# Patient Record
Sex: Female | Born: 1995 | Race: White | Hispanic: No | Marital: Single | Smoking: Current every day smoker
Health system: Southern US, Community
[De-identification: ages and names within clinical notes are randomized; demographics above are authoritative.]

## PROBLEM LIST (undated history)

## (undated) DIAGNOSIS — F419 Anxiety disorder, unspecified: Secondary | ICD-10-CM

## (undated) HISTORY — PX: TONSILLECTOMY: SUR1361

## (undated) HISTORY — PX: CHOLECYSTECTOMY: SHX55

---
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
## 2016-03-27 ENCOUNTER — Other Ambulatory Visit: Payer: Self-pay | Admitting: Gastroenterology

## 2016-03-27 DIAGNOSIS — R1013 Epigastric pain: Secondary | ICD-10-CM

## 2016-04-19 ENCOUNTER — Ambulatory Visit
Admission: RE | Admit: 2016-04-19 | Discharge: 2016-04-19 | Disposition: A | Discharge status: Home / Self Care | Payer: Medicaid Other | Source: Ambulatory Visit | Attending: Gastroenterology | Admitting: Gastroenterology

## 2016-04-19 DIAGNOSIS — R1013 Epigastric pain: Secondary | ICD-10-CM | POA: Insufficient documentation

## 2016-04-19 DIAGNOSIS — N1330 Unspecified hydronephrosis: Secondary | ICD-10-CM | POA: Insufficient documentation

## 2016-04-19 MED ORDER — TECHNETIUM TC 99M MEBROFENIN IV KIT
5.1700 | PACK | Freq: Once | INTRAVENOUS | Status: AC | PRN
  Administered 2016-04-19: 5.17 via INTRAVENOUS

## 2016-05-08 ENCOUNTER — Other Ambulatory Visit: Payer: Self-pay | Admitting: Gastroenterology

## 2016-05-08 DIAGNOSIS — R1013 Epigastric pain: Secondary | ICD-10-CM

## 2016-05-12 ENCOUNTER — Ambulatory Visit
Admission: RE | Admit: 2016-05-12 | Discharge: 2016-05-12 | Disposition: A | Discharge status: Home / Self Care | Payer: Medicaid Other | Source: Ambulatory Visit | Attending: Gastroenterology | Admitting: Gastroenterology

## 2016-05-12 DIAGNOSIS — R1013 Epigastric pain: Secondary | ICD-10-CM | POA: Diagnosis not present

## 2016-05-12 DIAGNOSIS — K219 Gastro-esophageal reflux disease without esophagitis: Secondary | ICD-10-CM | POA: Insufficient documentation

## 2017-06-08 ENCOUNTER — Encounter: Payer: Self-pay | Admitting: Certified Nurse Midwife

## 2018-08-08 ENCOUNTER — Emergency Department
Admission: EM | Admit: 2018-08-08 | Discharge: 2018-08-08 | Disposition: A | Discharge status: Home / Self Care | Payer: Medicaid - Out of State | Attending: Emergency Medicine | Admitting: Emergency Medicine

## 2018-08-08 ENCOUNTER — Other Ambulatory Visit: Payer: Self-pay

## 2018-08-08 ENCOUNTER — Emergency Department: Payer: Medicaid - Out of State

## 2018-08-08 DIAGNOSIS — F1210 Cannabis abuse, uncomplicated: Secondary | ICD-10-CM | POA: Diagnosis not present

## 2018-08-08 DIAGNOSIS — R0602 Shortness of breath: Secondary | ICD-10-CM | POA: Insufficient documentation

## 2018-08-08 DIAGNOSIS — R079 Chest pain, unspecified: Secondary | ICD-10-CM | POA: Diagnosis present

## 2018-08-08 DIAGNOSIS — F17210 Nicotine dependence, cigarettes, uncomplicated: Secondary | ICD-10-CM | POA: Insufficient documentation

## 2018-08-08 HISTORY — DX: Anxiety disorder, unspecified: F41.9

## 2018-08-08 LAB — HEPATIC FUNCTION PANEL
ALBUMIN: 4.2 g/dL (ref 3.5–5.0)
ALT: 27 U/L (ref 0–44)
AST: 24 U/L (ref 15–41)
Alkaline Phosphatase: 89 U/L (ref 38–126)
Bilirubin, Direct: <0.1 mg/dL (ref 0.0–0.2)
TOTAL PROTEIN: 7.1 g/dL (ref 6.5–8.1)
Total Bilirubin: 0.4 mg/dL (ref 0.3–1.2)

## 2018-08-08 LAB — TROPONIN I

## 2018-08-08 LAB — URINALYSIS, COMPLETE (UACMP) WITH MICROSCOPIC
BILIRUBIN URINE: NEGATIVE
Bacteria, UA: NONE SEEN
Glucose, UA: NEGATIVE mg/dL
KETONES UR: NEGATIVE mg/dL
LEUKOCYTE UA: NEGATIVE
Nitrite: NEGATIVE
PROTEIN: NEGATIVE mg/dL
Specific Gravity, Urine: 1.024 (ref 1.005–1.030)
pH: 6 (ref 5.0–8.0)

## 2018-08-08 LAB — CBC
HEMATOCRIT: 38.1 % (ref 36.0–46.0)
Hemoglobin: 12.6 g/dL (ref 12.0–15.0)
MCH: 29 pg (ref 26.0–34.0)
MCHC: 33.1 g/dL (ref 30.0–36.0)
MCV: 87.6 fL (ref 80.0–100.0)
NRBC: 0 % (ref 0.0–0.2)
PLATELETS: 302 10*3/uL (ref 150–400)
RBC: 4.35 MIL/uL (ref 3.87–5.11)
RDW: 12.1 % (ref 11.5–15.5)
WBC: 11.5 10*3/uL — AB (ref 4.0–10.5)

## 2018-08-08 LAB — BASIC METABOLIC PANEL
Anion gap: 10 (ref 5–15)
BUN: 14 mg/dL (ref 6–20)
CHLORIDE: 109 mmol/L (ref 98–111)
CO2: 21 mmol/L — ABNORMAL LOW (ref 22–32)
CREATININE: 0.7 mg/dL (ref 0.44–1.00)
Calcium: 9.5 mg/dL (ref 8.9–10.3)
Glucose, Bld: 121 mg/dL — ABNORMAL HIGH (ref 70–99)
Potassium: 3.4 mmol/L — ABNORMAL LOW (ref 3.5–5.1)
SODIUM: 140 mmol/L (ref 135–145)

## 2018-08-08 LAB — URINE DRUG SCREEN, QUALITATIVE (ARMC ONLY)
AMPHETAMINES, UR SCREEN: NOT DETECTED
BARBITURATES, UR SCREEN: NOT DETECTED
BENZODIAZEPINE, UR SCRN: NOT DETECTED
Cannabinoid 50 Ng, Ur ~~LOC~~: POSITIVE — AB
Cocaine Metabolite,Ur ~~LOC~~: NOT DETECTED
MDMA (Ecstasy)Ur Screen: NOT DETECTED
METHADONE SCREEN, URINE: NOT DETECTED
OPIATE, UR SCREEN: NOT DETECTED
PHENCYCLIDINE (PCP) UR S: NOT DETECTED
Tricyclic, Ur Screen: NOT DETECTED

## 2018-08-08 LAB — FIBRIN DERIVATIVES D-DIMER (ARMC ONLY): FIBRIN DERIVATIVES D-DIMER (ARMC): 942.34 ng{FEU}/mL — AB (ref 0.00–499.00)

## 2018-08-08 LAB — LIPASE, BLOOD: LIPASE: 27 U/L (ref 11–51)

## 2018-08-08 LAB — POCT PREGNANCY, URINE: Preg Test, Ur: NEGATIVE

## 2018-08-08 LAB — T4, FREE: Free T4: 1.09 ng/dL (ref 0.82–1.77)

## 2018-08-08 LAB — TSH: TSH: 1.903 u[IU]/mL (ref 0.350–4.500)

## 2018-08-08 MED ORDER — FAMOTIDINE IN NACL 20-0.9 MG/50ML-% IV SOLN
20.0000 mg | Freq: Once | INTRAVENOUS | Status: AC
  Administered 2018-08-08: 20 mg via INTRAVENOUS
  Filled 2018-08-08: qty 50

## 2018-08-08 MED ORDER — IOHEXOL 350 MG/ML SOLN
100.0000 mL | Freq: Once | INTRAVENOUS | Status: AC | PRN
  Administered 2018-08-08: 100 mL via INTRAVENOUS

## 2018-08-08 MED ORDER — ALBUTEROL SULFATE (2.5 MG/3ML) 0.083% IN NEBU
2.5000 mg | INHALATION_SOLUTION | Freq: Once | RESPIRATORY_TRACT | Status: AC
  Administered 2018-08-08: 2.5 mg via RESPIRATORY_TRACT
  Filled 2018-08-08: qty 3

## 2018-08-08 MED ORDER — SODIUM CHLORIDE 0.9 % IV BOLUS
1000.0000 mL | Freq: Once | INTRAVENOUS | Status: AC
  Administered 2018-08-08: 1000 mL via INTRAVENOUS

## 2018-08-08 NOTE — ED Notes (Signed)
Denies cough,congestion and fever

## 2018-08-08 NOTE — ED Triage Notes (Addendum)
Pt arrives to ED via POV from home with c/o SHOB, RLQ abdominal pain, and bilateral leg numbness x40 minutes PTA. No c/o's of N/V/D, no fever. No recent cough or illness. Pt reports h/x of anxiety, but denies h/x of "panic attacks". Pt is A&O, in NAD; RR even, regular, and unlabored.

## 2018-08-08 NOTE — Discharge Instructions (Addendum)
1.  Take ibuprofen as needed for discomfort. 2.  Apply moist heat to affected area several times daily. 3.  Return to the ER for worsening symptoms, persistent vomiting, difficulty breathing or other concerns.

## 2018-08-08 NOTE — ED Provider Notes (Signed)
[facility] Emergency Department Provider Note   ____________________________________________   First MD Initiated Contact with Patient 08/08/18 0354     (approximate)  I have reviewed the triage vital signs and the nursing notes.   HISTORY  Chief Complaint Shortness of Breath    HPI Anita Barajas is a 23 y.o. female who presents to the ED from home with a chief complaint of  chest pain and shortness of breath.  Patient reports at 1 AM she was sitting in bed watching TV, no exertion, no emotional distress, when she had sudden onset of sharp central chest pain associated with shortness of breath.  She felt panicky and numb.  States that she has had some right upper abdominal pain for the past few days.  Has a history of cholecystectomy.  Denies recent fever, chills, cough, congestion, nausea, vomiting, dysuria, diarrhea.  Denies recent travel or trauma.   Past Medical History:  Diagnosis Date  . Anxiety     There are no active problems to display for this patient.   Past Surgical History:  Procedure Laterality Date  . CHOLECYSTECTOMY    . TONSILLECTOMY      Prior to Admission medications   Medication Sig Start Date End Date Taking? Authorizing Provider  ibuprofen (ADVIL,MOTRIN) 200 MG tablet Take 200 mg by mouth as needed for fever, headache or mild pain.   Yes [provider]    Allergies Patient has no known allergies.  No family history on file.  Social History Social History   Tobacco Use  . Smoking status: Current Every Day Smoker  . Smokeless tobacco: Never Used  Substance Use Topics  . Alcohol use: Not on file  . Drug use: Not on file    Review of Systems  Constitutional: No fever/chills Eyes: No visual changes. ENT: No sore throat. Cardiovascular: Positive for chest pain. Respiratory: Positive for shortness of breath. Gastrointestinal: Positive for abdominal pain.  No nausea, no vomiting.  No diarrhea.  No  constipation. Genitourinary: Negative for dysuria. Musculoskeletal: Negative for back pain. Skin: Negative for rash. Neurological: Negative for headaches, focal weakness or numbness.   ____________________________________________   PHYSICAL EXAM:  VITAL SIGNS: ED Triage Vitals  Enc Vitals Group     BP 08/08/18 0217 114/85     Pulse Rate 08/08/18 0217 (!) 141     Resp 08/08/18 0217 18     Temp 08/08/18 0217 98.3 F (36.8 C)     Temp Source 08/08/18 0217 Oral     SpO2 08/08/18 0217 100 %     Weight 08/08/18 0218 200 lb (90.7 kg)     Height 08/08/18 0218 5\' 8"  (1.727 m)     Head Circumference --      Peak Flow --      Pain Score 08/08/18 0218 6     Pain Loc --      Pain Edu? --      Excl. in GC? --     Constitutional: Alert and oriented. Well appearing and in no acute distress. Eyes: Conjunctivae are normal. PERRL. EOMI. Head: Atraumatic. Nose: No congestion/rhinnorhea. Mouth/Throat: Mucous membranes are moist.  Oropharynx non-erythematous. Neck: No stridor.   Cardiovascular: Normal rate, regular rhythm. Grossly normal heart sounds.  Good peripheral circulation. Respiratory: Normal respiratory effort.  No retractions. Lungs slightly diminished bilaterally. Gastrointestinal: Obese.  Soft and nontender to light or deep palpation. No distention. No abdominal bruits. No CVA tenderness. Musculoskeletal: No lower extremity tenderness nor edema.  No calf pain.  No joint effusions.  2+ femoral and distal pulses. Neurologic:  Normal speech and language. No gross focal neurologic deficits are appreciated. No gait instability. Skin:  Skin is warm, dry and intact. No rash noted. Psychiatric: Mood and affect are slightly anxious. Speech and behavior are normal.  ____________________________________________   LABS (all labs ordered are listed, but only abnormal results are displayed)  Labs Reviewed  BASIC METABOLIC PANEL - Abnormal; Notable for the following components:       Result Value   Potassium 3.4 (*)    CO2 21 (*)    Glucose, Bld 121 (*)    All other components within normal limits  CBC - Abnormal; Notable for the following components:   WBC 11.5 (*)    All other components within normal limits  FIBRIN DERIVATIVES D-DIMER ([facility] ONLY) - Abnormal; Notable for the following components:   Fibrin derivatives D-dimer ([facility]) 942.34 (*)    All other components within normal limits  URINALYSIS, COMPLETE (UACMP) WITH MICROSCOPIC - Abnormal; Notable for the following components:   Color, Urine YELLOW (*)    APPearance CLEAR (*)    Hgb urine dipstick MODERATE (*)    All other components within normal limits  URINE DRUG SCREEN, QUALITATIVE ([facility] ONLY) - Abnormal; Notable for the following components:   Cannabinoid 50 Ng, Ur [place] POSITIVE (*)    All other components within normal limits  TROPONIN I  HEPATIC FUNCTION PANEL  LIPASE, BLOOD  TROPONIN I  TSH  T4, FREE  POC URINE PREG, ED  POCT PREGNANCY, URINE   ____________________________________________  EKG  ED ECG REPORT I, Jamir Rone J, the attending physician, personally viewed and interpreted this ECG.   Date: 08/08/2018  EKG Time: 0220  Rate: 124  Rhythm: sinus tachycardia  Axis: Normal  Intervals:none  ST&T Change: Nonspecific  ____________________________________________  RADIOLOGY  ED MD interpretation: No acute cardiopulmonary process; no PE  Official radiology report(s): Dg Chest 2 View  Result Date: 08/08/2018 CLINICAL DATA:  Shortness of breath EXAM: CHEST - 2 VIEW COMPARISON:  None FINDINGS: The heart size and mediastinal contours are within normal limits. Both lungs are clear. The visualized skeletal structures are unremarkable. IMPRESSION: No active cardiopulmonary disease. Electronically Signed   By: Deatra Robinson M.D.   On: 08/08/2018 02:39   Ct Angio Chest Pe W/cm &/or Wo Cm  Result Date: 08/08/2018 CLINICAL DATA:  Chest pain and shortness of breath. EXAM: CT ANGIOGRAPHY  CHEST WITH CONTRAST TECHNIQUE: Multidetector CT imaging of the chest was performed using the standard protocol during bolus administration of intravenous contrast. Multiplanar CT image reconstructions and MIPs were obtained to evaluate the vascular anatomy. CONTRAST:  OMNIPAQUE IOHEXOL 350 MG/ML SOLN COMPARISON:  None. FINDINGS: Cardiovascular: Suboptimal (due to soft tissue attenuation and bolus dispersion) but satisfactory opacification of the pulmonary arteries to the segmental level. No evidence of pulmonary embolism. Normal heart size. No pericardial effusion. Mediastinum/Nodes: Negative for adenopathy Lungs/Pleura: There is no edema, consolidation, effusion, or pneumothorax. Upper Abdomen: Negative Musculoskeletal: Negative Review of the MIP images confirms the above findings. IMPRESSION: Negative chest CTA. Electronically Signed   By: Marnee Spring M.D.   On: 08/08/2018 06:31    ____________________________________________   PROCEDURES  Procedure(s) performed: None  Procedures  Critical Care performed: No  ____________________________________________   INITIAL IMPRESSION / ASSESSMENT AND PLAN / ED COURSE  As part of my medical decision making, I reviewed the following data within the electronic MEDICAL RECORD NUMBER Nursing notes reviewed  and incorporated, Labs reviewed, EKG interpreted, Old chart reviewed, Radiograph reviewed and Notes from prior ED visits    23 year old female who presents with chest pain and shortness of breath.  Admits to marijuana use around 9 PM. Differential diagnosis includes, but is not limited to, ACS, aortic dissection, pulmonary embolism, cardiac tamponade, pneumothorax, pneumonia, pericarditis, myocarditis, GI-related causes including esophagitis/gastritis, and musculoskeletal chest wall pain.    Laboratory results including troponin from triage unremarkable.  Will add LFTs, lipase for complaints of abdominal pain.  Will repeat timed troponin.  Will  check d-dimer, UA, UDS.  Will initiate IV fluid resuscitation, albuterol nebulizer treatment, IV Pepcid and reassess.   Clinical Course as of Aug 08 705  Thu Aug 08, 2018  0515 Elevated d-dimer noted.  Will obtain CTA chest to evaluate for pulmonary embolus.   [JS]  [id number] Updated patient on negative CT result.  Strict return precautions given.  Patient verbalizes understanding and agrees with plan of care.   [JS]    Clinical Course User Index [JS] Irean HongSung, Romulo Okray J, MD     ____________________________________________   FINAL CLINICAL IMPRESSION(S) / ED DIAGNOSES  Final diagnoses:  Chest pain, unspecified type     ED Discharge Orders    None       Note:  This document was prepared using Dragon voice recognition software and may include unintentional dictation errors.   Irean HongSung, Terrisa Curfman J, MD 08/08/18 [phone]

## 2018-08-19 ENCOUNTER — Other Ambulatory Visit: Payer: Self-pay

## 2018-08-19 ENCOUNTER — Encounter: Payer: Self-pay | Admitting: Emergency Medicine

## 2018-08-19 DIAGNOSIS — F17200 Nicotine dependence, unspecified, uncomplicated: Secondary | ICD-10-CM | POA: Insufficient documentation

## 2018-08-19 DIAGNOSIS — G43909 Migraine, unspecified, not intractable, without status migrainosus: Secondary | ICD-10-CM | POA: Diagnosis not present

## 2018-08-19 DIAGNOSIS — R51 Headache: Secondary | ICD-10-CM | POA: Diagnosis present

## 2018-08-19 NOTE — ED Triage Notes (Signed)
Patient ambulatory to triage with steady gait, without difficulty or distress noted; pt reports x wk having right sided HA accomp by nausea; denies hx of same; BC powder taken at 5pm without relief

## 2018-08-20 ENCOUNTER — Emergency Department
Admission: EM | Admit: 2018-08-20 | Discharge: 2018-08-20 | Disposition: A | Discharge status: Home / Self Care | Payer: Medicaid - Out of State | Attending: Emergency Medicine | Admitting: Emergency Medicine

## 2018-08-20 DIAGNOSIS — F410 Panic disorder [episodic paroxysmal anxiety] without agoraphobia: Secondary | ICD-10-CM

## 2018-08-20 DIAGNOSIS — G43909 Migraine, unspecified, not intractable, without status migrainosus: Secondary | ICD-10-CM

## 2018-08-20 MED ORDER — METOCLOPRAMIDE HCL 5 MG/ML IJ SOLN
10.0000 mg | INTRAMUSCULAR | Status: AC
  Administered 2018-08-20: 10 mg via INTRAVENOUS
  Filled 2018-08-20: qty 2

## 2018-08-20 MED ORDER — ACETAMINOPHEN 500 MG PO TABS
1000.0000 mg | ORAL_TABLET | Freq: Once | ORAL | Status: AC
  Administered 2018-08-20: 1000 mg via ORAL
  Filled 2018-08-20: qty 2

## 2018-08-20 MED ORDER — DEXAMETHASONE SODIUM PHOSPHATE 10 MG/ML IJ SOLN
10.0000 mg | Freq: Once | INTRAMUSCULAR | Status: AC
  Administered 2018-08-20: 10 mg via INTRAVENOUS
  Filled 2018-08-20: qty 1

## 2018-08-20 MED ORDER — SODIUM CHLORIDE 0.9 % IV BOLUS
500.0000 mL | Freq: Once | INTRAVENOUS | Status: AC
  Administered 2018-08-20: 500 mL via INTRAVENOUS

## 2018-08-20 MED ORDER — KETOROLAC TROMETHAMINE 30 MG/ML IJ SOLN
15.0000 mg | Freq: Once | INTRAMUSCULAR | Status: AC
  Administered 2018-08-20: 15 mg via INTRAVENOUS
  Filled 2018-08-20: qty 1

## 2018-08-20 MED ORDER — DIPHENHYDRAMINE HCL 50 MG/ML IJ SOLN
25.0000 mg | INTRAMUSCULAR | Status: AC
  Administered 2018-08-20: 25 mg via INTRAVENOUS
  Filled 2018-08-20: qty 1

## 2018-08-20 MED ORDER — LORAZEPAM 2 MG/ML IJ SOLN
1.0000 mg | Freq: Once | INTRAMUSCULAR | Status: AC
  Administered 2018-08-20: 1 mg via INTRAVENOUS
  Filled 2018-08-20: qty 1

## 2018-08-20 NOTE — ED Notes (Signed)
Pt voicing concern to this RN that she is ready to go home. Pt states she feels as though being in the hospital is giving her more anxiety and she wishes to go home. Pt states her head feels better, but wishes to go home. MD aware and at bedside to talk to pt. This RN will continue to monitor.

## 2018-08-20 NOTE — ED Provider Notes (Signed)
[facility] Emergency Department Provider Note  ____________________________________________   First MD Initiated Contact with Patient 08/20/18 0148     (approximate)  I have reviewed the triage vital signs and the nursing notes.   HISTORY  Chief Complaint Headache    HPI Anita Barajas is a 23 y.o. female with no contributory past medical history except possibly for anxiety who presents for evaluation of a right-sided severe headache that is been persistent for the last week.  She says it was gradual in onset and sometimes will improve or completely go away but comes right back.  There is no particular pattern as it occurs both in the morning and at night and it is not made better or worse by any particular activity.  She has been tried to take ibuprofen but it does not seem to help.  She has seen some spots from time to time in her vision.  Bright lights and loud noises seem to make it worse and she says that it is worse when she is feeling stressed.  She denies fever/chills, neck pain, neck stiffness, chest pain, shortness of breath, vomiting, and abdominal pain.  She has had some nausea associated with the pain.  She has not had headaches before and has no history of migraines.     Past Medical History:  Diagnosis Date  . Anxiety     There are no active problems to display for this patient.   Past Surgical History:  Procedure Laterality Date  . CHOLECYSTECTOMY    . TONSILLECTOMY      Prior to Admission medications   Medication Sig Start Date End Date Taking? Authorizing Provider  ibuprofen (ADVIL,MOTRIN) 200 MG tablet Take 200 mg by mouth as needed for fever, headache or mild pain.    [provider]    Allergies Patient has no known allergies.  No family history on file.  Social History Social History   Tobacco Use  . Smoking status: Current Every Day Smoker  . Smokeless tobacco: Never Used  Substance Use Topics  . Alcohol  use: Not on file  . Drug use: Yes    Types: Marijuana    Review of Systems Constitutional: No fever/chills Eyes: No visual deficits, sometimes sees some spots in her vision. ENT: No sore throat. Cardiovascular: Denies chest pain. Respiratory: Denies shortness of breath. Gastrointestinal: No abdominal pain.  Nausea, no vomiting.  No diarrhea.  No constipation. Genitourinary: Negative for dysuria. Musculoskeletal: Negative for neck pain.  Negative for back pain. Integumentary: Negative for rash. Neurological: Right-sided intermittent but persistent headache for about a week.  No focal numbness nor weakness.   ____________________________________________   PHYSICAL EXAM:  VITAL SIGNS: ED Triage Vitals  Enc Vitals Group     BP 08/19/18 1952 125/88     Pulse Rate 08/19/18 1952 (!) 113     Resp 08/19/18 1952 18     Temp 08/19/18 1952 98.8 F (37.1 C)     Temp Source 08/19/18 1952 Oral     SpO2 08/19/18 1952 100 %     Weight 08/19/18 1954 90.7 kg (200 lb)     Height 08/19/18 1954 1.727 m ( )     Head Circumference --      Peak Flow --      Pain Score 08/19/18 1954 8     Pain Loc --      Pain Edu? --      Excl. in GC? --  Constitutional: Alert and oriented. Well appearing and in no acute distress. Eyes: Conjunctivae are normal. PERRL. EOMI. Head: Atraumatic. Nose: No congestion/rhinnorhea. Mouth/Throat: Mucous membranes are moist. Neck: No stridor.  No meningeal signs.   Cardiovascular: Normal rate, regular rhythm. Good peripheral circulation. Grossly normal heart sounds. Respiratory: Normal respiratory effort.  No retractions. Lungs CTAB. Gastrointestinal: Soft and nontender. No distention.  Musculoskeletal: No lower extremity tenderness nor edema. No gross deformities of extremities. Neurologic:  Normal speech and language. No gross focal neurologic deficits are appreciated.  Skin:  Skin is warm, dry and intact. No rash noted. Psychiatric: Mood and affect are  normal. Speech and behavior are normal.  ____________________________________________   LABS (all labs ordered are listed, but only abnormal results are displayed)  Labs Reviewed - No data to display ____________________________________________  EKG  None - EKG not ordered by ED physician ____________________________________________  RADIOLOGY   ED MD interpretation: No indication for imaging  Official radiology report(s): No results found.  ____________________________________________   PROCEDURES   Procedure(s) performed (including Critical Care):  Procedures   ____________________________________________   INITIAL IMPRESSION / MDM / ASSESSMENT AND PLAN / ED COURSE  As part of my medical decision making, I reviewed the following data within the electronic MEDICAL RECORD NUMBER Nursing notes reviewed and incorporated, Notes from prior ED visits and [place] Controlled Substance Database       Differential diagnosis includes, but is not limited to, intracranial hemorrhage, meningitis/encephalitis, previous head trauma, cavernous venous thrombosis, tension headache, temporal arteritis, migraine or migraine equivalent, idiopathic intracranial hypertension, and non-specific headache.  However the patient is very well-appearing and in no distress.  Vital signs are normal and she is afebrile.  The symptoms are most consistent with a migraine.  I explained to her I do not think that she needs a head CT at this time and she is in agreement.  She says that there is no chance she could be pregnant so we will not check.  I have ordered an IV and the following medications: Normal saline 500 mL IV bolus, Reglan 10 mg IV, Benadryl 25 mg IV, Toradol 15 mg IV, Tylenol 1000 mg p.o., Decadron 10 mg IV.  The patient agrees to treatment for migraine and that I will reassess to determine if we need to do additional work-up.  I think that this is unlikely to be an emergent medical condition and will  likely be appropriate for outpatient follow-up.  Clinical Course as of Aug 20 322  Tue Aug 20, 2018  0300 The patient suddenly became very anxious and feels like her heart is pounding.  She keeps stating "I feel anxious, I do not feel right".  She will not stay still and is walking around the room.  She is not specifically having extraparametal side effects but I think she may be having a reaction either to the metoclopramide, or she is having an anxiety attack in general based on the way the medication is making her feel.  Of note, she says her headache is gone.At first I suggested giving her additional Benadryl in case this is a side effect of the metoclopramide, but she does not want any more Benadryl.  I commenced her to take Ativan 1 mg IV which should be helpful if she is having an anxiety attack based on the way she is feeling.  We will continue to monitor while she gets her IV fluids.   [CF]  0321 The patient is feeling a little bit better but wants  to go home now.  She clarified with her nurse that she "does this at home all the time" meaning has panic attacks.  Her headache is gone and I believe there is no evidence of any acute medical condition at this time.  I gave my usual customary return precautions.   [CF]    Clinical Course User Index [CF] Loleta Rose, MD    ____________________________________________  FINAL CLINICAL IMPRESSION(S) / ED DIAGNOSES  Final diagnoses:  Migraine without status migrainosus, not intractable, unspecified migraine type  Panic attack     MEDICATIONS GIVEN DURING THIS VISIT:  Medications  dexamethasone (DECADRON) injection 10 mg (10 mg Intravenous Given 08/20/18 0232)  metoCLOPramide (REGLAN) injection 10 mg (10 mg Intravenous Given 08/20/18 0232)  diphenhydrAMINE (BENADRYL) injection 25 mg (25 mg Intravenous Given 08/20/18 0231)  ketorolac (TORADOL) 30 MG/ML injection 15 mg (15 mg Intravenous Given 08/20/18 0233)  acetaminophen (TYLENOL) tablet  1,000 mg (1,000 mg Oral Given 08/20/18 0226)  sodium chloride 0.9 % bolus 500 mL (0 mLs Intravenous Stopped 08/20/18 0321)  LORazepam (ATIVAN) injection 1 mg (1 mg Intravenous Given 08/20/18 0306)     ED Discharge Orders    None       Note:  This document was prepared using Dragon voice recognition software and may include unintentional dictation errors.   Loleta Rose, MD 08/20/18 [phone]

## 2018-08-20 NOTE — Discharge Instructions (Signed)

## 2018-08-22 ENCOUNTER — Encounter: Payer: Self-pay | Admitting: Emergency Medicine

## 2018-08-22 ENCOUNTER — Emergency Department: Payer: Medicaid - Out of State

## 2018-08-22 ENCOUNTER — Other Ambulatory Visit: Payer: Self-pay

## 2018-08-22 ENCOUNTER — Emergency Department
Admission: EM | Admit: 2018-08-22 | Discharge: 2018-08-22 | Disposition: A | Discharge status: Home / Self Care | Payer: Medicaid - Out of State | Attending: Student in an Organized Health Care Education/Training Program | Admitting: Student in an Organized Health Care Education/Training Program

## 2018-08-22 DIAGNOSIS — R51 Headache: Secondary | ICD-10-CM | POA: Insufficient documentation

## 2018-08-22 DIAGNOSIS — R519 Headache, unspecified: Secondary | ICD-10-CM

## 2018-08-22 DIAGNOSIS — R079 Chest pain, unspecified: Secondary | ICD-10-CM | POA: Diagnosis present

## 2018-08-22 DIAGNOSIS — F17200 Nicotine dependence, unspecified, uncomplicated: Secondary | ICD-10-CM | POA: Insufficient documentation

## 2018-08-22 LAB — CBC WITH DIFFERENTIAL/PLATELET
Abs Immature Granulocytes: 0.03 10*3/uL (ref 0.00–0.07)
Basophils Absolute: 0 10*3/uL (ref 0.0–0.1)
Basophils Relative: 0 %
Eosinophils Absolute: 0 10*3/uL (ref 0.0–0.5)
Eosinophils Relative: 0 %
HEMATOCRIT: 36.4 % (ref 36.0–46.0)
Hemoglobin: 12.1 g/dL (ref 12.0–15.0)
Immature Granulocytes: 0 %
Lymphocytes Relative: 22 %
Lymphs Abs: 2 10*3/uL (ref 0.7–4.0)
MCH: 29.5 pg (ref 26.0–34.0)
MCHC: 33.2 g/dL (ref 30.0–36.0)
MCV: 88.8 fL (ref 80.0–100.0)
MONOS PCT: 7 %
Monocytes Absolute: 0.6 10*3/uL (ref 0.1–1.0)
Neutro Abs: 6.3 10*3/uL (ref 1.7–7.7)
Neutrophils Relative %: 71 %
Platelets: 296 10*3/uL (ref 150–400)
RBC: 4.1 MIL/uL (ref 3.87–5.11)
RDW: 12.6 % (ref 11.5–15.5)
WBC: 8.9 10*3/uL (ref 4.0–10.5)
nRBC: 0 % (ref 0.0–0.2)

## 2018-08-22 LAB — BASIC METABOLIC PANEL
Anion gap: 7 (ref 5–15)
BUN: 10 mg/dL (ref 6–20)
CO2: 22 mmol/L (ref 22–32)
Calcium: 9.1 mg/dL (ref 8.9–10.3)
Chloride: 107 mmol/L (ref 98–111)
Creatinine, Ser: 0.59 mg/dL (ref 0.44–1.00)
GFR calc Af Amer: >60 mL/min (ref >60)
GFR calc non Af Amer: >60 mL/min (ref >60)
Glucose, Bld: 112 mg/dL — ABNORMAL HIGH (ref 70–99)
Potassium: 3.5 mmol/L (ref 3.5–5.1)
Sodium: 136 mmol/L (ref 135–145)

## 2018-08-22 LAB — POCT PREGNANCY, URINE: Preg Test, Ur: NEGATIVE

## 2018-08-22 MED ORDER — KETOROLAC TROMETHAMINE 30 MG/ML IJ SOLN: 15.0000 mg | Freq: Once | INTRAMUSCULAR | Status: DC

## 2018-08-22 MED ORDER — DIPHENHYDRAMINE HCL 50 MG/ML IJ SOLN
12.5000 mg | Freq: Once | INTRAMUSCULAR | Status: DC
  Filled 2018-08-22: qty 1

## 2018-08-22 MED ORDER — DEXAMETHASONE SODIUM PHOSPHATE 10 MG/ML IJ SOLN: 10.0000 mg | Freq: Once | INTRAMUSCULAR | Status: DC

## 2018-08-22 MED ORDER — PROCHLORPERAZINE EDISYLATE 10 MG/2ML IJ SOLN
10.0000 mg | Freq: Once | INTRAMUSCULAR | Status: AC
  Administered 2018-08-22: 10 mg via INTRAVENOUS
  Filled 2018-08-22: qty 2

## 2018-08-22 MED ORDER — MAGNESIUM SULFATE 2 GM/50ML IV SOLN
2.0000 g | Freq: Once | INTRAVENOUS | Status: DC
  Filled 2018-08-22: qty 50

## 2018-08-22 NOTE — Discharge Instructions (Addendum)

## 2018-08-22 NOTE — ED Notes (Signed)
Pt discharged home after verbalizing understanding of discharge instructions; nad noted. 

## 2018-08-22 NOTE — ED Notes (Signed)
Pt refuses all further treatment; wants to go home.

## 2018-08-22 NOTE — ED Provider Notes (Signed)
[facility] Emergency Department Provider Note    First MD Initiated Contact with Patient 08/22/18 [phone]     (approximate)  I have reviewed the triage vital signs and the nursing notes.   HISTORY  Chief Complaint Chest Pain and Headache    HPI KELCEY FREIBERG is a 23 y.o. female history of anxiety who recently moved back to the area presents with a headache since she moved back to [place] area.  Denies any history of headaches.  Not on birth control.  Also complaining of some mild right-sided chest discomfort feeling like there is a pressure on it.  States that she has been more stressed out since returning.  Denies any blurry vision or numbness or tingling.  States that she feels "weird.  Was given migraine cocktail with improvement in the symptoms but the pain returned.    Past Medical History:  Diagnosis Date  . Anxiety    No family history on file. Past Surgical History:  Procedure Laterality Date  . CHOLECYSTECTOMY    . TONSILLECTOMY     There are no active problems to display for this patient.     Prior to Admission medications   Medication Sig Start Date End Date Taking? Authorizing Provider  ibuprofen (ADVIL,MOTRIN) 200 MG tablet Take 200 mg by mouth as needed for fever, headache or mild pain.    [provider]    Allergies Patient has no known allergies.    Social History Social History   Tobacco Use  . Smoking status: Current Every Day Smoker  . Smokeless tobacco: Never Used  Substance Use Topics  . Alcohol use: Not on file  . Drug use: Yes    Types: Marijuana    Review of Systems Patient denies headaches, rhinorrhea, blurry vision, numbness, shortness of breath, chest pain, edema, cough, abdominal pain, nausea, vomiting, diarrhea, dysuria, fevers, rashes or hallucinations unless otherwise stated above in HPI. ____________________________________________   PHYSICAL EXAM:  VITAL SIGNS: Vitals:   08/22/18  0729  BP: (!) 141/91  Pulse: (!) 104  Resp: 18  Temp: 98 F (36.7 C)  SpO2: 98%    Constitutional: Alert and oriented.  Eyes: Conjunctivae are normal.  Head: Atraumatic. Nose: No congestion/rhinnorhea. Mouth/Throat: Mucous membranes are moist.   Neck: No stridor. Painless ROM.  Cardiovascular: Normal rate, regular rhythm. Grossly normal heart sounds.  Good peripheral circulation. Respiratory: Normal respiratory effort.  No retractions. Lungs CTAB. Gastrointestinal: Soft and nontender. No distention. No abdominal bruits. No CVA tenderness. Genitourinary:  Musculoskeletal: No lower extremity tenderness nor edema.  No joint effusions. Neurologic: CN- intact.  No facial droop, Normal FNF.  Normal heel to shin.  Sensation intact bilaterally. Normal speech and language. No gross focal neurologic deficits are appreciated. No gait instability. Skin:  Skin is warm, dry and intact. No rash noted. Psychiatric: Mood and affect are normal. Speech and behavior are normal.  ____________________________________________   LABS (all labs ordered are listed, but only abnormal results are displayed)  No results found for this or any previous visit (from the past 24 hour(s)). ____________________________________________  EKG My review and personal interpretation at Time: 7:28   Indication: chest pain  Rate: 95  Rhythm: sinus Axis: normal Other: normal intervals, nostemi ____________________________________________  RADIOLOGY  I personally reviewed all radiographic images ordered to evaluate for the above acute complaints and reviewed radiology reports and findings.  These findings were personally discussed with the patient.  Please see medical record for radiology report.  ____________________________________________  PROCEDURES  Procedure(s) performed:  Procedures    Critical Care performed: no ____________________________________________   INITIAL IMPRESSION / ASSESSMENT AND  PLAN / ED COURSE  Pertinent labs & imaging results that were available during my care of the patient were reviewed by me and considered in my medical decision making (see chart for details).   DDX: Tension headache, cluster, migraine, anxiety, mass doubt bleed or infectious process  KALLIOPI FELICIANO is a 23 y.o. who presents to the ED with symptoms as described above.  Patient with nonfocal neuro exam but very anxious and requesting CT imaging which is not unreasonable as this is her second time to the ER for headache 1 week.  Seems to be undergoing quite a bit of stress.  Will treat with migraine cocktail check blood work and reassess.  Clinical Course as of Aug 22 942  Thu Aug 22, 2018  3335 Patient ambulating about with no acute distress.  Appears comfortable.  No focal neuro deficits.  CT imaging without any evidence of bleed, mass or other lesion.   [PR]  4562 Patient reassessed.  Headache resolved.  Remains hemodynamically stable.  At this point do believe she stable and appropriate for outpatient follow-up.  We will give referral to neurology.   [PR]    Clinical Course User Index [PR] Willy Eddy, MD     As part of my medical decision making, I reviewed the following data within the electronic MEDICAL RECORD NUMBER Nursing notes reviewed and incorporated, Labs reviewed, notes from prior ED visits and [place] Controlled Substance Database   ____________________________________________   FINAL CLINICAL IMPRESSION(S) / ED DIAGNOSES  Final diagnoses:  Bad headache  Nonspecific chest pain      NEW MEDICATIONS STARTED DURING THIS VISIT:  New Prescriptions   No medications on file     Note:  This document was prepared using Dragon voice recognition software and may include unintentional dictation errors.    Willy Eddy, MD 08/22/18 [phone]

## 2018-08-22 NOTE — ED Notes (Signed)
Pt declined benadryl because she states it makes her anxious. Pt taken to CT; will start mag upon her return.

## 2018-08-22 NOTE — ED Notes (Addendum)
Patient ambulatory to Rm 10. [facility] RN aware of room placement.

## 2018-08-22 NOTE — ED Triage Notes (Signed)
Pt arrives with complaints of having a headache and left sided chest pain for the last week. Pt states she was seen and evaluated for the same complaints but reports "it has just not let up." Pt states the only time she is not in pain is when she is asleep.

## 2018-08-22 NOTE — ED Notes (Signed)
Pt presents with cp and headache x 10 days. She states that it makes her "feel weird; like I'm not here." She reports that pain is constant, consistent. Pt alert & oriented with nad noted.

## 2018-08-22 NOTE — ED Notes (Signed)
PER MD STAFFORD no other protocols at this time

## 2018-08-22 NOTE — ED Notes (Signed)
MD Robinson at bedside 

## 2019-04-16 ENCOUNTER — Other Ambulatory Visit: Payer: Self-pay

## 2019-04-16 DIAGNOSIS — Z20822 Contact with and (suspected) exposure to covid-19: Secondary | ICD-10-CM

## 2019-04-17 LAB — NOVEL CORONAVIRUS, NAA: SARS-CoV-2, NAA: NOT DETECTED

## 2019-05-11 ENCOUNTER — Emergency Department
Admission: EM | Admit: 2019-05-11 | Discharge: 2019-05-11 | Disposition: A | Discharge status: Home / Self Care | Payer: Self-pay | Attending: Emergency Medicine | Admitting: Emergency Medicine

## 2019-05-11 ENCOUNTER — Other Ambulatory Visit: Payer: Self-pay

## 2019-05-11 ENCOUNTER — Encounter: Payer: Self-pay | Admitting: Emergency Medicine

## 2019-05-11 DIAGNOSIS — Y998 Other external cause status: Secondary | ICD-10-CM | POA: Insufficient documentation

## 2019-05-11 DIAGNOSIS — F17200 Nicotine dependence, unspecified, uncomplicated: Secondary | ICD-10-CM | POA: Insufficient documentation

## 2019-05-11 DIAGNOSIS — S80862A Insect bite (nonvenomous), left lower leg, initial encounter: Secondary | ICD-10-CM | POA: Insufficient documentation

## 2019-05-11 DIAGNOSIS — W57XXXA Bitten or stung by nonvenomous insect and other nonvenomous arthropods, initial encounter: Secondary | ICD-10-CM | POA: Insufficient documentation

## 2019-05-11 DIAGNOSIS — Y9389 Activity, other specified: Secondary | ICD-10-CM | POA: Insufficient documentation

## 2019-05-11 DIAGNOSIS — F1210 Cannabis abuse, uncomplicated: Secondary | ICD-10-CM | POA: Insufficient documentation

## 2019-05-11 DIAGNOSIS — Y929 Unspecified place or not applicable: Secondary | ICD-10-CM | POA: Insufficient documentation

## 2019-05-11 MED ORDER — LORATADINE 10 MG PO TABS: 10.0000 mg | ORAL_TABLET | Freq: Every day | ORAL | 2 refills | Status: AC

## 2019-05-11 MED ORDER — PREDNISONE 10 MG (21) PO TBPK: ORAL_TABLET | ORAL | 0 refills | Status: AC

## 2019-05-11 NOTE — ED Provider Notes (Signed)
[facility] Emergency Department Provider Note  ____________________________________________   First MD Initiated Contact with Patient 05/11/19 1652     (approximate)  I have reviewed the triage vital signs and the nursing notes.   HISTORY  Chief Complaint Insect Bite    HPI Anita Barajas is a 23 y.o. female presents emergency department with concerns of a wheeled bug sting to the back of her left thigh.  Patient states she felt a sting.  She has a large pink area surrounding it.  No difficulty breathing.  No throat swelling.    Past Medical History:  Diagnosis Date  . Anxiety     There are no active problems to display for this patient.   Past Surgical History:  Procedure Laterality Date  . CHOLECYSTECTOMY    . TONSILLECTOMY      Prior to Admission medications   Medication Sig Start Date End Date Taking? Authorizing Provider  ibuprofen (ADVIL,MOTRIN) 200 MG tablet Take 200 mg by mouth as needed for fever, headache or mild pain.    [provider]  loratadine (CLARITIN) 10 MG tablet Take 1 tablet (10 mg total) by mouth daily. 05/11/19 05/10/20  , Roselyn Bering, PA-C  predniSONE (STERAPRED UNI-PAK 21 TAB) 10 MG (21) TBPK tablet Take 6 pills on day one then decrease by 1 pill each day 05/11/19   Faythe Ghee, PA-C    Allergies Hydrocodone and Diphenhydramine-zinc acetate  No family history on file.  Social History Social History   Tobacco Use  . Smoking status: Current Every Day Smoker  . Smokeless tobacco: Never Used  Substance Use Topics  . Alcohol use: Not on file  . Drug use: Yes    Types: Marijuana    Review of Systems  Constitutional: No fever/chills Eyes: No visual changes. ENT: No sore throat. Respiratory: Denies cough Genitourinary: Negative for dysuria. Musculoskeletal: Negative for back pain. Skin: Negative for rash.  Positive for insect sting    ____________________________________________    PHYSICAL EXAM:  VITAL SIGNS: ED Triage Vitals  Enc Vitals Group     BP 05/11/19 1630 120/81     Pulse Rate 05/11/19 1630 83     Resp 05/11/19 1638 18     Temp 05/11/19 1630 98.6 F (37 C)     Temp Source 05/11/19 1630 Oral     SpO2 05/11/19 1630 100 %     Weight 05/11/19 1630 243 lb (110.2 kg)     Height 05/11/19 1630 5\' 9"  (1.753 m)     Head Circumference --      Peak Flow --      Pain Score 05/11/19 1636 8     Pain Loc --      Pain Edu? --      Excl. in GC? --     Constitutional: Alert and oriented. Well appearing and in no acute distress. Eyes: Conjunctivae are normal.  Head: Atraumatic. Nose: No congestion/rhinnorhea. Mouth/Throat: Mucous membranes are moist.   Neck:  supple no lymphadenopathy noted Cardiovascular: Normal rate, regular rhythm. Heart sounds are normal Respiratory: Normal respiratory effort.  No retractions, lungs c t a  GU: deferred Musculoskeletal: FROM all extremities, warm and well perfused Neurologic:  Normal speech and language.  Skin:  Skin is warm, dry, positive for a insect bite lesion in the center of a pink area.  No drainage noted  no stinger noted psychiatric: Mood and affect are normal. Speech and behavior are normal.  ____________________________________________   LABS (  all labs ordered are listed, but only abnormal results are displayed)  Labs Reviewed - No data to display ____________________________________________   ____________________________________________  RADIOLOGY    ____________________________________________   PROCEDURES  Procedure(s) performed: No  Procedures    ____________________________________________   INITIAL IMPRESSION / ASSESSMENT AND PLAN / ED COURSE  Pertinent labs & imaging results that were available during my care of the patient were reviewed by me and considered in my medical decision making (see chart for details).   Patient is 23 year old female presents emergency department  complaint insect bite. Physical exam shows a insect bite on the back of the left thigh.  Remainder of exam is unremarkable  Explained to the patient that she can take over-the-counter Benadryl.  She states it makes her nauseated.  Therefore she could take Claritin.  Explained she could put hydrocortisone cream on the area.  If not improving in 1 to 2 days then she could start a steroid pack.  She was instructed to follow-up with her regular doctor.  Return if worsening.  She is discharged stable condition.    Anita Barajas was evaluated in Emergency Department on 05/11/2019 for the symptoms described in the history of present illness. She was evaluated in the context of the global COVID-19 pandemic, which necessitated consideration that the patient might be at risk for infection with the SARS-CoV-2 virus that causes COVID-19. Institutional protocols and algorithms that pertain to the evaluation of patients at risk for COVID-19 are in a state of rapid change based on information released by regulatory bodies including the CDC and federal and state organizations. These policies and algorithms were followed during the patient's care in the ED.   As part of my medical decision making, I reviewed the following data within the [place] notes reviewed and incorporated, Old chart reviewed, Notes from prior ED visits and [place] Controlled Substance Database  ____________________________________________   FINAL CLINICAL IMPRESSION(S) / ED DIAGNOSES  Final diagnoses:  Insect bite of left lower extremity, initial encounter      NEW MEDICATIONS STARTED DURING THIS VISIT:  New Prescriptions   LORATADINE (CLARITIN) 10 MG TABLET    Take 1 tablet (10 mg total) by mouth daily.   PREDNISONE (STERAPRED UNI-PAK 21 TAB) 10 MG (21) TBPK TABLET    Take 6 pills on day one then decrease by 1 pill each day     Note:  This document was prepared using Dragon voice recognition software and  may include unintentional dictation errors.    Versie Starks, PA-C 05/11/19 1704    Lavonia Drafts, MD 05/11/19 [phone]

## 2019-05-11 NOTE — ED Triage Notes (Signed)
Pt arrived via POV with reports of wheel bug sting to back of left thigh.  Puncture mark noted, ice applied.

## 2019-05-11 NOTE — ED Notes (Signed)
NAD noted at time of D/C. Pt denies questions or concerns. Pt ambulatory to the lobby at this time.  

## 2019-05-11 NOTE — Discharge Instructions (Addendum)
Follow-up with your regular doctor if not improving in 3 to 5 days.  Take over-the-counter Claritin.  Apply ice.  If not improving by tomorrow you can start taking the steroid pack.  Apply hydrocortisone cream to the area.  Return if worsening

## 2020-04-16 IMAGING — CT CT ANGIO CHEST
2 of 6 series · 19 of 46 positions shown · IV contrast (omnipaque)
Comparison: None.

CLINICAL DATA: Chest pain and shortness of breath.

EXAM:
CT ANGIOGRAPHY CHEST WITH CONTRAST
TECHNIQUE: Multidetector CT imaging of the chest was performed using the
standard protocol during bolus administration of intravenous
contrast. Multiplanar CT image reconstructions and MIPs were
obtained to evaluate the vascular anatomy.
CONTRAST:  100mL OMNIPAQUE IOHEXOL 350 MG/ML SOLN

[Series 6: thins · axial · 0.69mm/px · z∈[-487,-256]mm · 16 of 254 slices shown]
[im 12/254  lung]
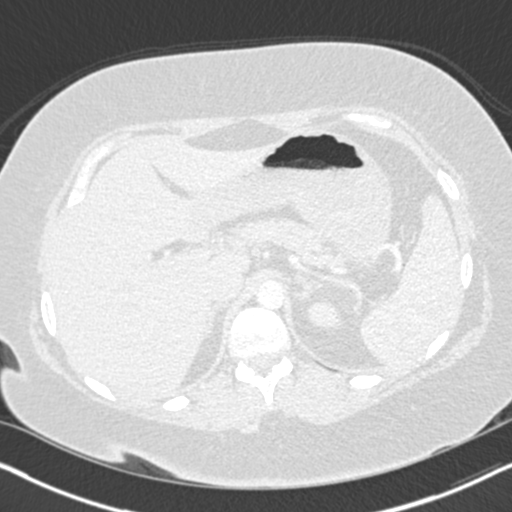
[im 34/254  soft-tissue]
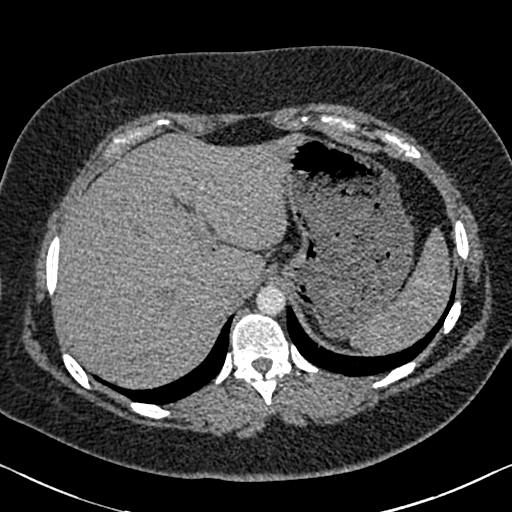
[im 45/254  lung]
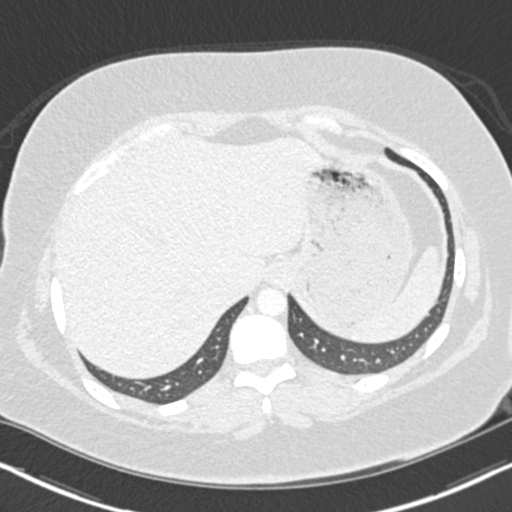
[im 56/254  soft-tissue]
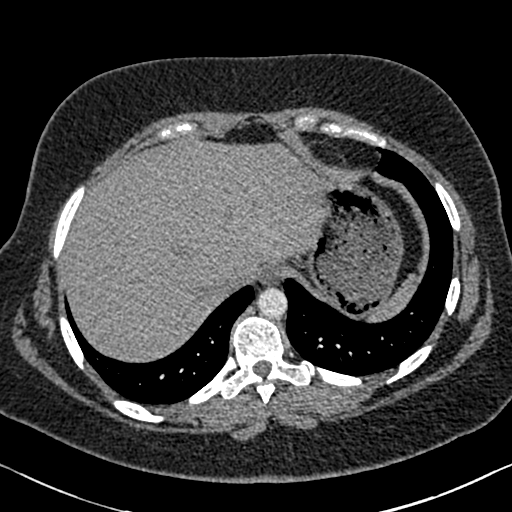
[im 78/254  lung]
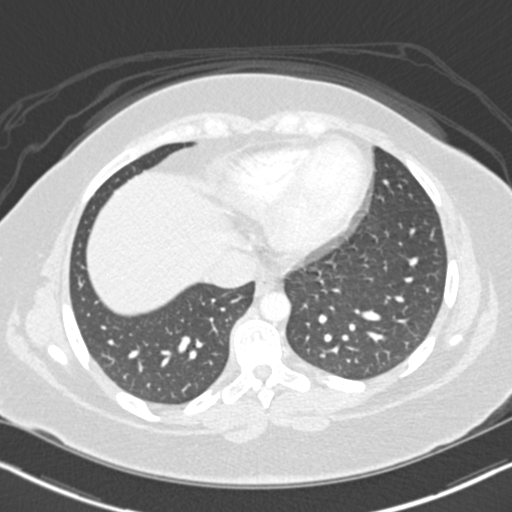
[im 89/254  soft-tissue]
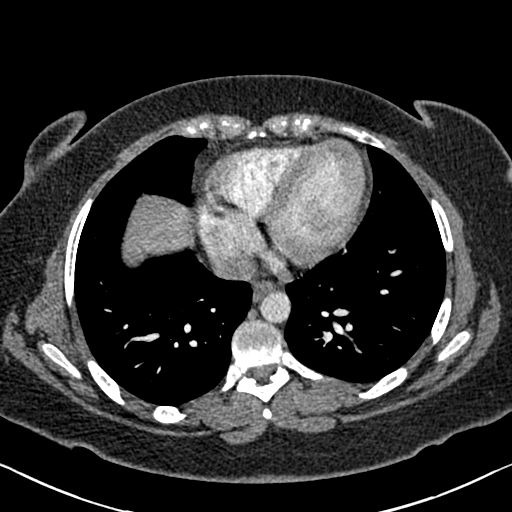
[im 100/254  lung]
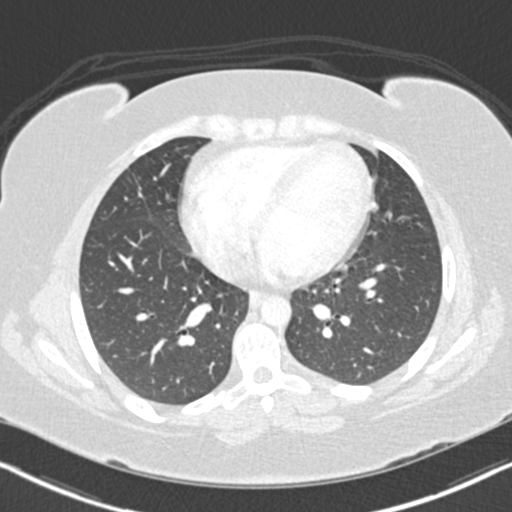
[im 122/254  soft-tissue]
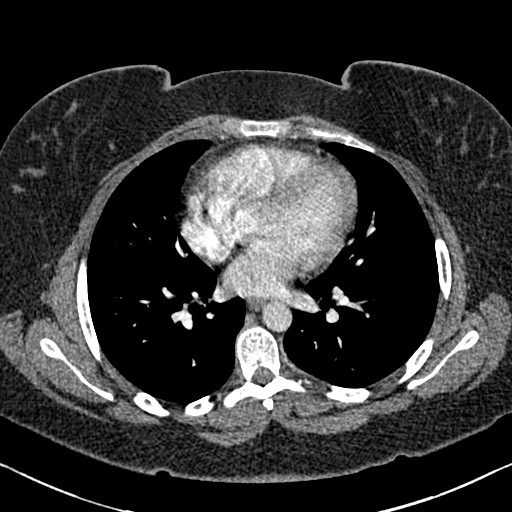
[im 133/254  lung]
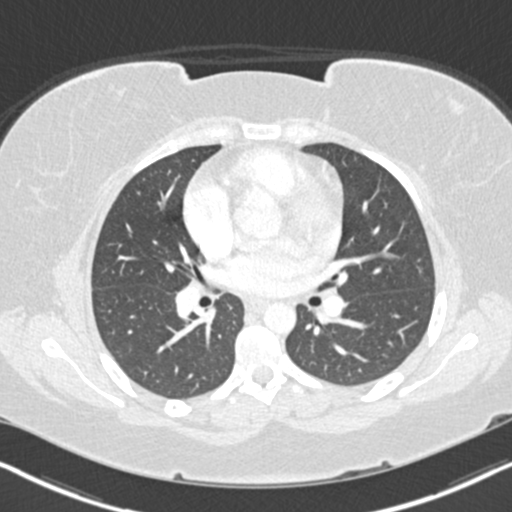
[im 155/254  soft-tissue]
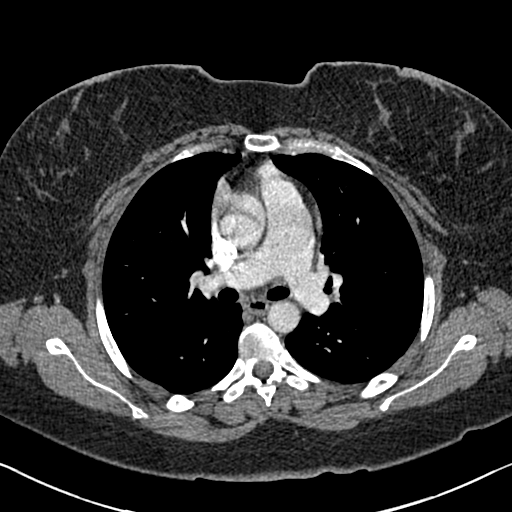
[im 166/254  lung]
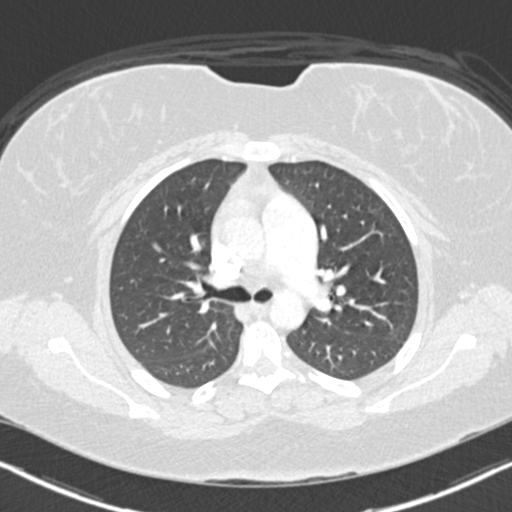
[im 177/254  soft-tissue]
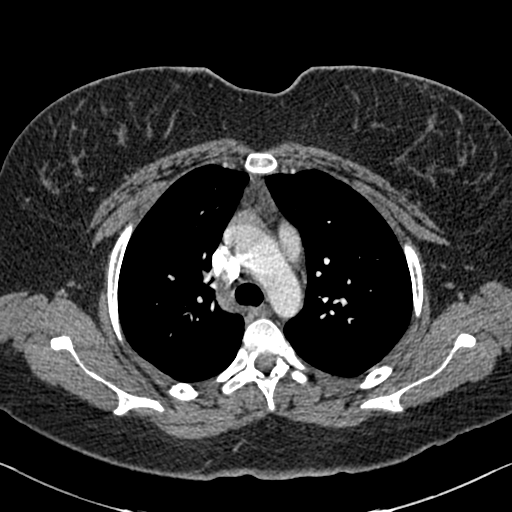
[im 199/254  lung]
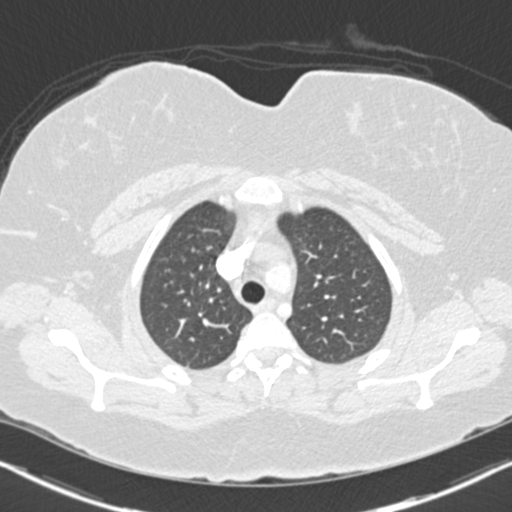
[im 210/254  soft-tissue]
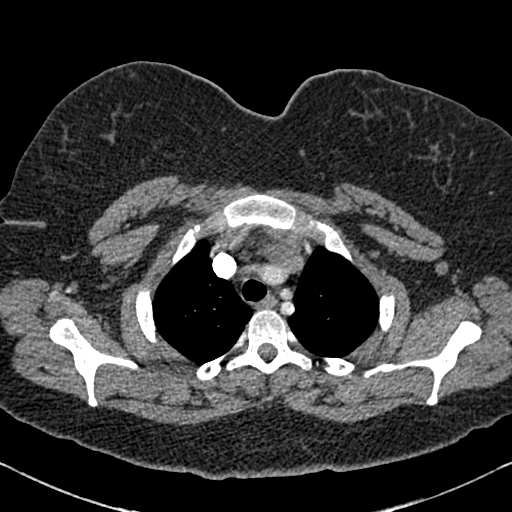
[im 221/254  lung]
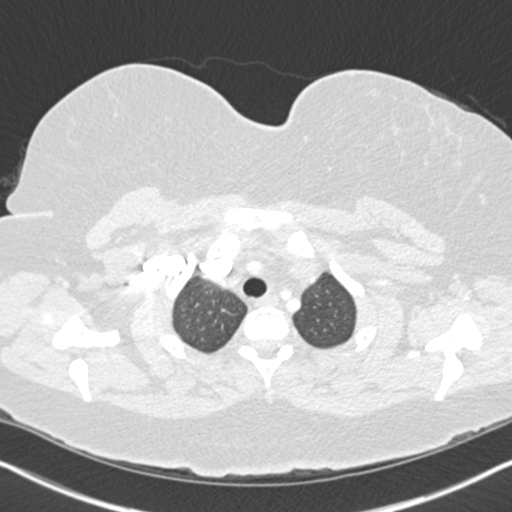
[im 243/254  soft-tissue]
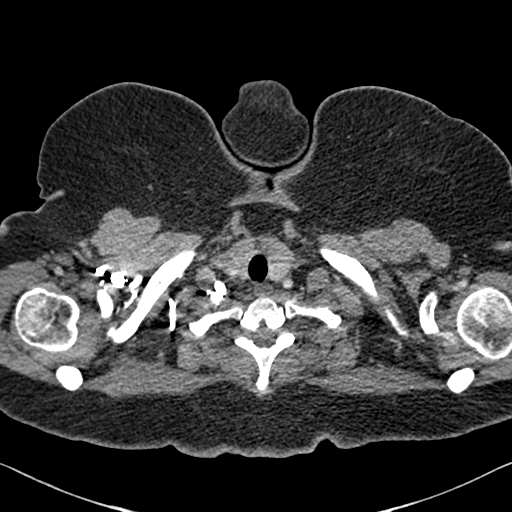

[Series 8: coronal mpr · coronal · 0.56mm/px · 3 of 80 slices shown]
[im 20/80  soft-tissue]
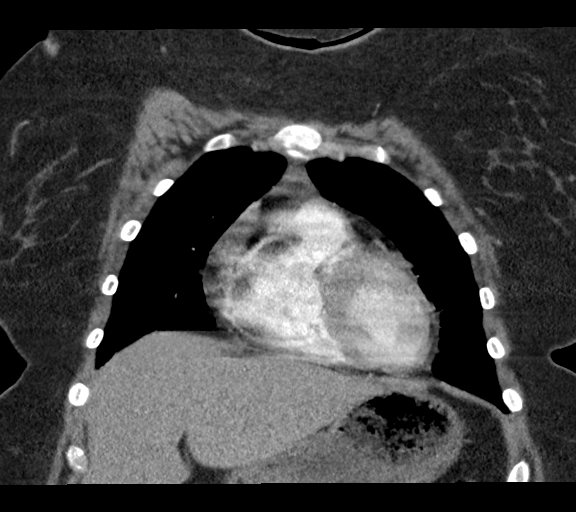
[im 40/80  soft-tissue]
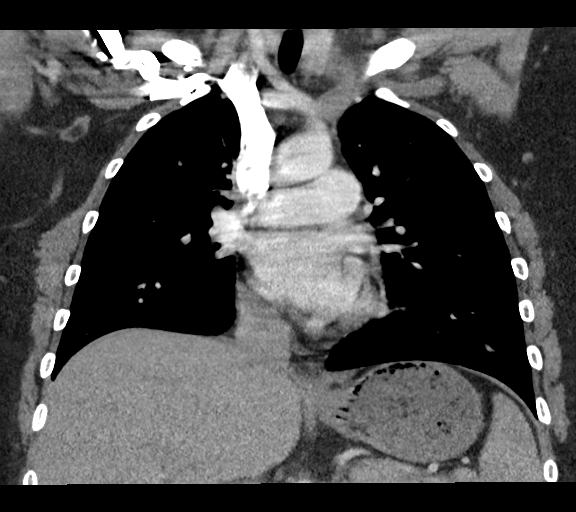
[im 60/80  soft-tissue]
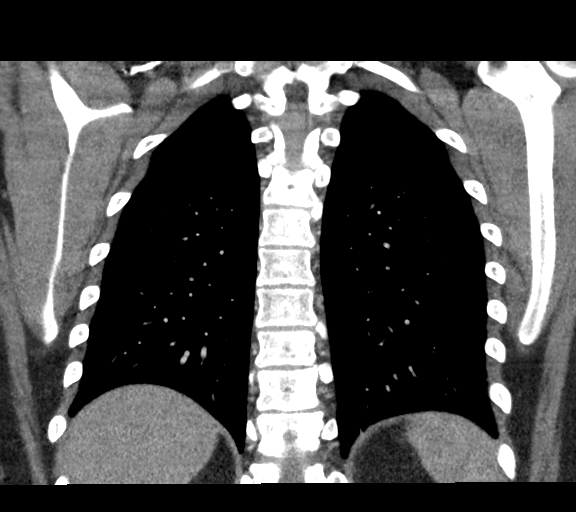

[19 of 46 positions shown; findings below may reference images not displayed]

FINDINGS: Cardiovascular: Suboptimal (due to soft tissue attenuation and bolus
dispersion) but satisfactory opacification of the pulmonary arteries
to the segmental level. No evidence of pulmonary embolism. Normal
heart size. No pericardial effusion.

Mediastinum/Nodes: Negative for adenopathy

Lungs/Pleura: There is no edema, consolidation, effusion, or
pneumothorax.

Upper Abdomen: Negative

Musculoskeletal: Negative

Review of the MIP images confirms the above findings.
IMPRESSION: Negative chest CTA.

## 2020-04-16 IMAGING — CR DG CHEST 2V
2 series · 2 of 2 positions shown · non-contrast
Comparison: None

CLINICAL DATA: Shortness of breath

EXAM:
CHEST - 2 VIEW

[chest pa]
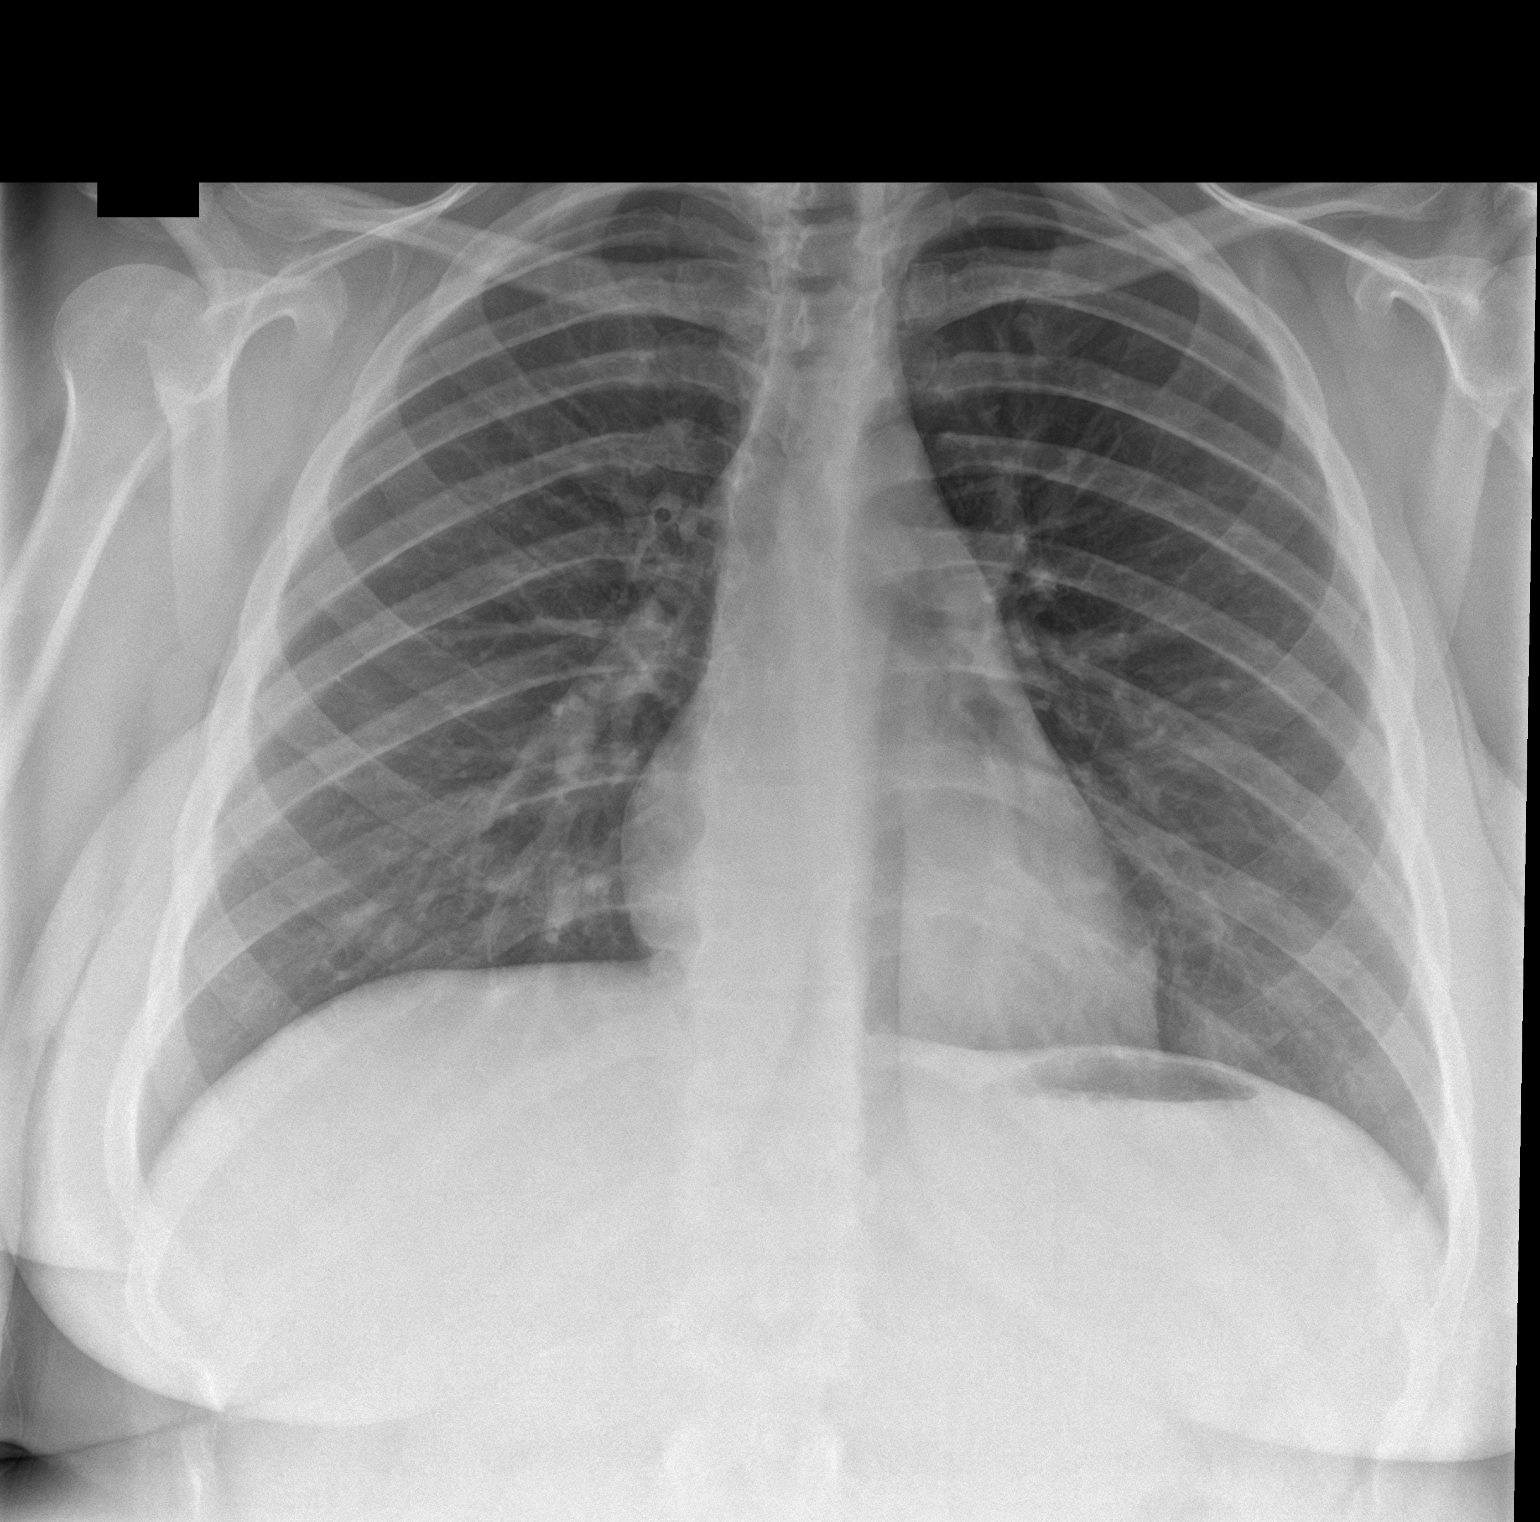

[chest lat]
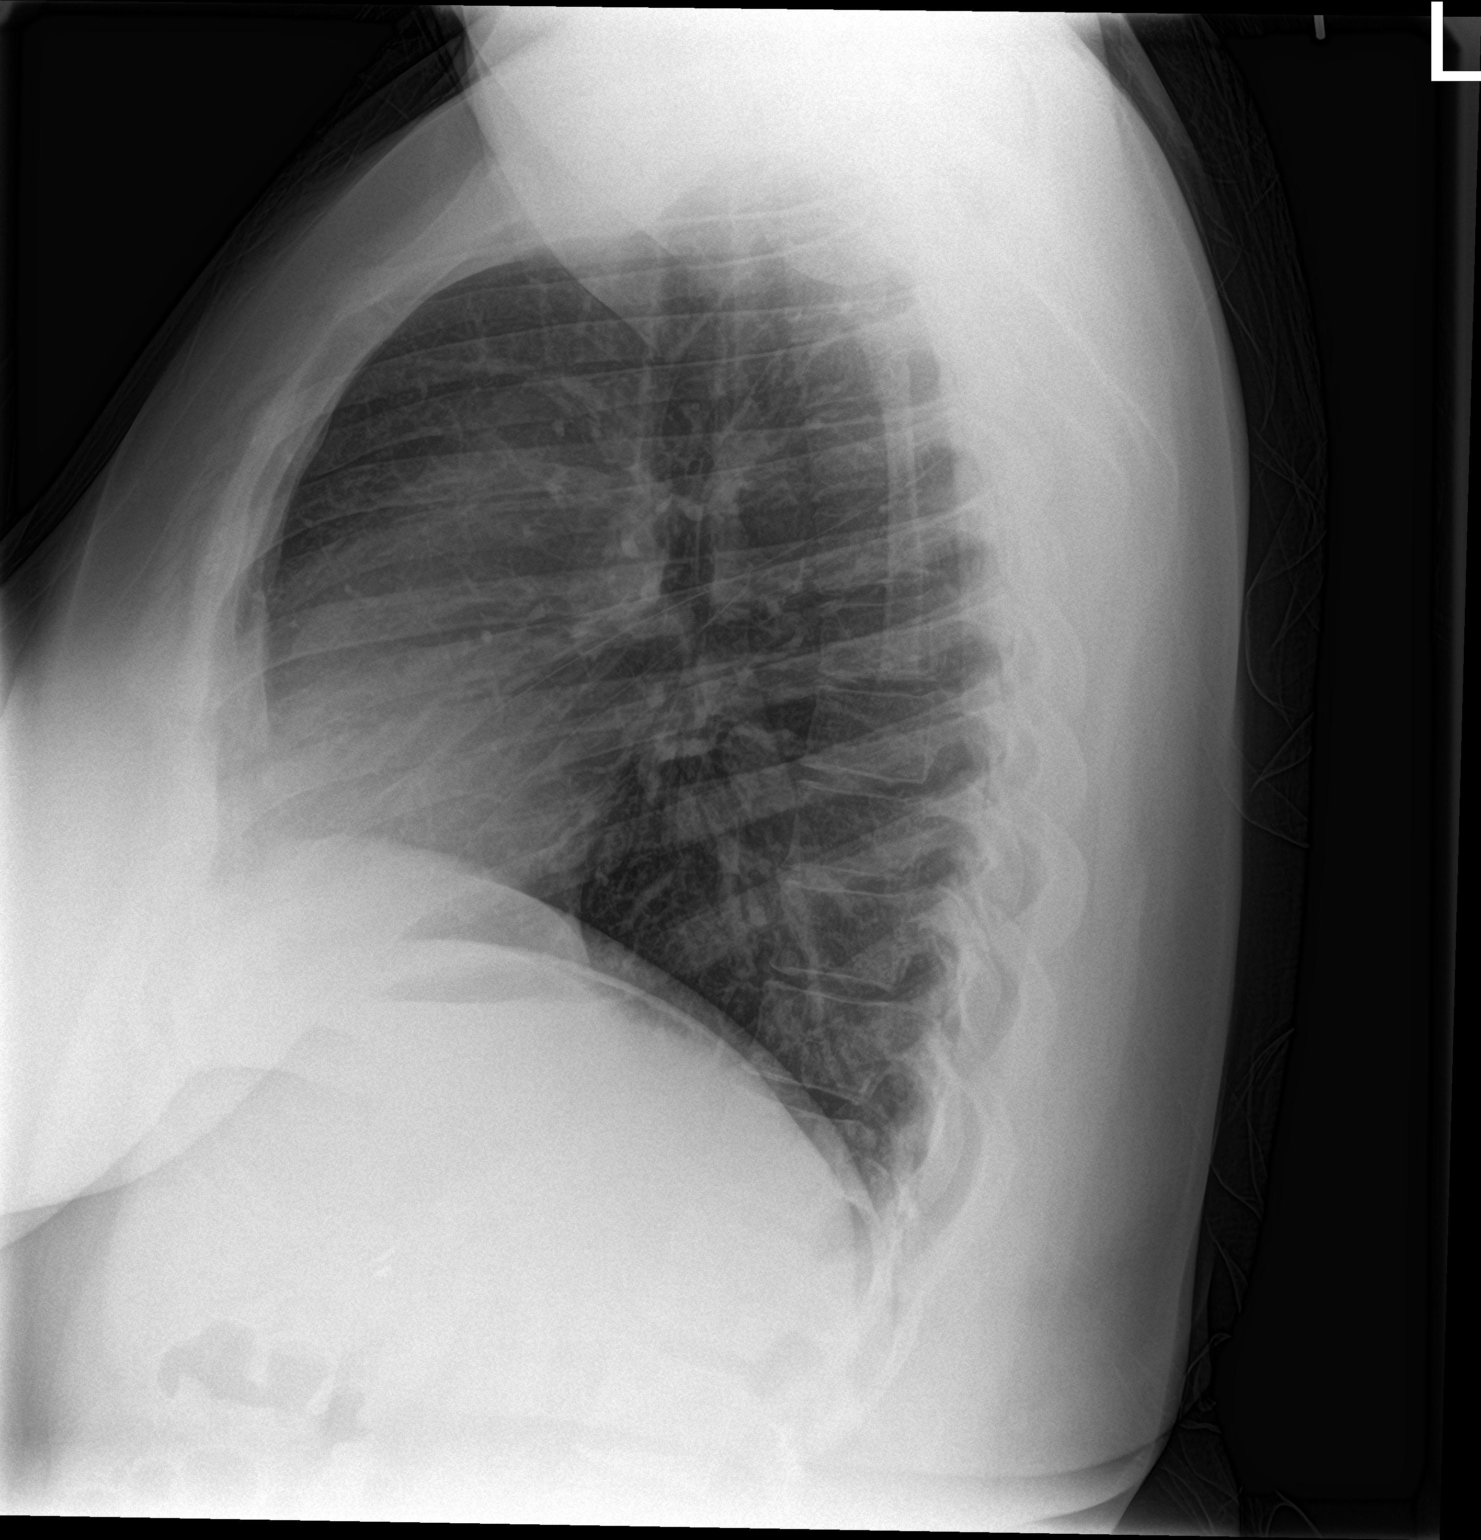

[2 of 2 positions shown; findings below may reference images not displayed]

FINDINGS: The heart size and mediastinal contours are within normal limits.
Both lungs are clear. The visualized skeletal structures are
unremarkable.
IMPRESSION: No active cardiopulmonary disease.

## 2021-02-12 ENCOUNTER — Other Ambulatory Visit: Payer: Self-pay

## 2021-02-12 DIAGNOSIS — Z5321 Procedure and treatment not carried out due to patient leaving prior to being seen by health care provider: Secondary | ICD-10-CM | POA: Insufficient documentation

## 2021-02-12 DIAGNOSIS — R1030 Lower abdominal pain, unspecified: Secondary | ICD-10-CM | POA: Insufficient documentation

## 2021-02-12 LAB — URINALYSIS, COMPLETE (UACMP) WITH MICROSCOPIC
Bacteria, UA: NONE SEEN
Bilirubin Urine: NEGATIVE
Glucose, UA: NEGATIVE mg/dL
Leukocytes,Ua: NEGATIVE
Nitrite: NEGATIVE
Protein, ur: NEGATIVE mg/dL
Specific Gravity, Urine: >1.03 — ABNORMAL HIGH (ref 1.005–1.030)
pH: 6 (ref 5.0–8.0)

## 2021-02-12 LAB — CBC
HCT: 36.1 % (ref 36.0–46.0)
Hemoglobin: 12.1 g/dL (ref 12.0–15.0)
MCH: 29.7 pg (ref 26.0–34.0)
MCHC: 33.5 g/dL (ref 30.0–36.0)
MCV: 88.7 fL (ref 80.0–100.0)
Platelets: 315 10*3/uL (ref 150–400)
RBC: 4.07 MIL/uL (ref 3.87–5.11)
RDW: 12.7 % (ref 11.5–15.5)
WBC: 10.6 10*3/uL — ABNORMAL HIGH (ref 4.0–10.5)
nRBC: 0 % (ref 0.0–0.2)

## 2021-02-12 LAB — LIPASE, BLOOD: Lipase: 31 U/L (ref 11–51)

## 2021-02-12 LAB — COMPREHENSIVE METABOLIC PANEL
ALT: 17 U/L (ref 0–44)
AST: 19 U/L (ref 15–41)
Albumin: 4.2 g/dL (ref 3.5–5.0)
Alkaline Phosphatase: 97 U/L (ref 38–126)
Anion gap: 9 (ref 5–15)
BUN: 11 mg/dL (ref 6–20)
CO2: 23 mmol/L (ref 22–32)
Calcium: 9.3 mg/dL (ref 8.9–10.3)
Chloride: 106 mmol/L (ref 98–111)
Creatinine, Ser: 0.71 mg/dL (ref 0.44–1.00)
GFR, Estimated: >60 mL/min (ref >60)
Glucose, Bld: 110 mg/dL — ABNORMAL HIGH (ref 70–99)
Potassium: 3.2 mmol/L — ABNORMAL LOW (ref 3.5–5.1)
Sodium: 138 mmol/L (ref 135–145)
Total Bilirubin: 0.4 mg/dL (ref 0.3–1.2)
Total Protein: 7.7 g/dL (ref 6.5–8.1)

## 2021-02-12 LAB — POC URINE PREG, ED: Preg Test, Ur: NEGATIVE

## 2021-02-12 NOTE — ED Triage Notes (Signed)
Patient reports having lower abdominal pain and that she felt a lump on left inside vaginal wall.

## 2021-02-13 ENCOUNTER — Emergency Department
Admission: EM | Admit: 2021-02-13 | Discharge: 2021-02-13 | Disposition: A | Discharge status: Home / Self Care | Payer: Medicaid - Out of State | Attending: Emergency Medicine | Admitting: Emergency Medicine

## 2021-04-21 ENCOUNTER — Ambulatory Visit
Admission: RE | Admit: 2021-04-21 | Discharge: 2021-04-21 | Disposition: A | Discharge status: Home / Self Care | Payer: Medicaid - Out of State | Source: Ambulatory Visit | Attending: Physician Assistant | Admitting: Physician Assistant

## 2021-04-21 ENCOUNTER — Other Ambulatory Visit: Payer: Self-pay | Admitting: Physician Assistant

## 2021-04-21 ENCOUNTER — Other Ambulatory Visit: Payer: Self-pay

## 2021-04-21 DIAGNOSIS — R109 Unspecified abdominal pain: Secondary | ICD-10-CM | POA: Insufficient documentation

## 2021-04-21 MED ORDER — IOHEXOL 300 MG/ML  SOLN
100.0000 mL | Freq: Once | INTRAMUSCULAR | Status: AC | PRN
  Administered 2021-04-21: 100 mL via INTRAVENOUS

## 2021-05-23 ENCOUNTER — Encounter: Payer: Medicaid Other | Admitting: Obstetrics and Gynecology

## 2021-06-28 ENCOUNTER — Encounter: Payer: Self-pay | Admitting: Emergency Medicine

## 2021-06-28 ENCOUNTER — Other Ambulatory Visit: Payer: Self-pay

## 2021-06-28 ENCOUNTER — Emergency Department
Admission: EM | Admit: 2021-06-28 | Discharge: 2021-06-28 | Disposition: A | Discharge status: Home / Self Care | Payer: Medicaid - Out of State | Attending: Emergency Medicine | Admitting: Emergency Medicine

## 2021-06-28 DIAGNOSIS — N390 Urinary tract infection, site not specified: Secondary | ICD-10-CM | POA: Diagnosis not present

## 2021-06-28 DIAGNOSIS — K529 Noninfective gastroenteritis and colitis, unspecified: Secondary | ICD-10-CM | POA: Diagnosis not present

## 2021-06-28 DIAGNOSIS — R112 Nausea with vomiting, unspecified: Secondary | ICD-10-CM | POA: Diagnosis present

## 2021-06-28 LAB — COMPREHENSIVE METABOLIC PANEL
ALT: 37 U/L (ref 0–44)
AST: 36 U/L (ref 15–41)
Albumin: 4.6 g/dL (ref 3.5–5.0)
Alkaline Phosphatase: 102 U/L (ref 38–126)
Anion gap: 10 (ref 5–15)
BUN: 14 mg/dL (ref 6–20)
CO2: 22 mmol/L (ref 22–32)
Calcium: 9.3 mg/dL (ref 8.9–10.3)
Chloride: 105 mmol/L (ref 98–111)
Creatinine, Ser: 0.72 mg/dL (ref 0.44–1.00)
GFR, Estimated: >60 mL/min (ref >60)
Glucose, Bld: 135 mg/dL — ABNORMAL HIGH (ref 70–99)
Potassium: 3.7 mmol/L (ref 3.5–5.1)
Sodium: 137 mmol/L (ref 135–145)
Total Bilirubin: 1.1 mg/dL (ref 0.3–1.2)
Total Protein: 8.6 g/dL — ABNORMAL HIGH (ref 6.5–8.1)

## 2021-06-28 LAB — CBC
HCT: 40.8 % (ref 36.0–46.0)
Hemoglobin: 13.5 g/dL (ref 12.0–15.0)
MCH: 29.3 pg (ref 26.0–34.0)
MCHC: 33.1 g/dL (ref 30.0–36.0)
MCV: 88.5 fL (ref 80.0–100.0)
Platelets: 350 10*3/uL (ref 150–400)
RBC: 4.61 MIL/uL (ref 3.87–5.11)
RDW: 12.3 % (ref 11.5–15.5)
WBC: 13.6 10*3/uL — ABNORMAL HIGH (ref 4.0–10.5)
nRBC: 0 % (ref 0.0–0.2)

## 2021-06-28 LAB — PREGNANCY, URINE: Preg Test, Ur: NEGATIVE

## 2021-06-28 LAB — URINALYSIS, ROUTINE W REFLEX MICROSCOPIC
Bilirubin Urine: NEGATIVE
Glucose, UA: NEGATIVE mg/dL
Hgb urine dipstick: NEGATIVE
Ketones, ur: NEGATIVE mg/dL
Nitrite: NEGATIVE
Protein, ur: 100 mg/dL — AB
Specific Gravity, Urine: 1.026 (ref 1.005–1.030)
Squamous Epithelial / HPF: >50 — ABNORMAL HIGH (ref 0–5)
pH: 9 — ABNORMAL HIGH (ref 5.0–8.0)

## 2021-06-28 MED ORDER — FAMOTIDINE 20 MG PO TABS: 20.0000 mg | ORAL_TABLET | Freq: Two times a day (BID) | ORAL | 0 refills | Status: AC

## 2021-06-28 MED ORDER — LORAZEPAM 2 MG/ML IJ SOLN
1.0000 mg | Freq: Once | INTRAMUSCULAR | Status: AC
  Administered 2021-06-28: 1 mg via INTRAMUSCULAR
  Filled 2021-06-28: qty 1

## 2021-06-28 MED ORDER — SODIUM CHLORIDE 0.9 % IV BOLUS
1000.0000 mL | Freq: Once | INTRAVENOUS | Status: AC
  Administered 2021-06-28: 1000 mL via INTRAVENOUS

## 2021-06-28 MED ORDER — FAMOTIDINE 20 MG PO TABS
20.0000 mg | ORAL_TABLET | Freq: Once | ORAL | Status: AC
  Administered 2021-06-28: 20 mg via ORAL
  Filled 2021-06-28: qty 1

## 2021-06-28 MED ORDER — DIPHENHYDRAMINE HCL 50 MG/ML IJ SOLN
25.0000 mg | Freq: Once | INTRAMUSCULAR | Status: DC
  Filled 2021-06-28: qty 1

## 2021-06-28 MED ORDER — CEPHALEXIN 500 MG PO CAPS: 500.0000 mg | ORAL_CAPSULE | Freq: Two times a day (BID) | ORAL | 0 refills | Status: AC

## 2021-06-28 MED ORDER — METOCLOPRAMIDE HCL 5 MG/ML IJ SOLN
10.0000 mg | Freq: Once | INTRAMUSCULAR | Status: AC
  Administered 2021-06-28: 10 mg via INTRAVENOUS
  Filled 2021-06-28: qty 2

## 2021-06-28 MED ORDER — ONDANSETRON 8 MG PO TBDP: 8.0000 mg | ORAL_TABLET | Freq: Three times a day (TID) | ORAL | 0 refills | Status: AC | PRN

## 2021-06-28 MED ORDER — FAMOTIDINE IN NACL 20-0.9 MG/50ML-% IV SOLN
20.0000 mg | Freq: Once | INTRAVENOUS | Status: AC
  Administered 2021-06-28: 20 mg via INTRAVENOUS
  Filled 2021-06-28: qty 50

## 2021-06-28 MED ORDER — ONDANSETRON 4 MG PO TBDP
4.0000 mg | ORAL_TABLET | Freq: Once | ORAL | Status: AC
  Administered 2021-06-28: 4 mg via ORAL
  Filled 2021-06-28: qty 1

## 2021-06-28 NOTE — Discharge Instructions (Addendum)
Take the Keflex (antibiotic) as prescribed and finish the full 7-day course.  This is for the urinary tract infection.  Take the Pepcid twice daily for the next several days to decrease stomach acid and help with upper abdominal pain.  Take the Zofran (ondansetron) as needed for nausea.  Return to the ER for new, worsening, or persistent severe nausea or vomiting, blood in the stool, abdominal pain, fever, weakness, or any other new or worsening symptoms that concern you.

## 2021-06-28 NOTE — ED Notes (Signed)
See triage note  states she developed some n/v last pm around 12 mn  last time vomited was about 1-2 hours ago  was given meds in triage  no fever

## 2021-06-28 NOTE — ED Notes (Signed)
Upon entering pt's room pt found standing, pacing, and visibly anxious with her IV taken out and placed on the bed. MD aware.

## 2021-06-28 NOTE — ED Triage Notes (Signed)
Pt states that she started vomiting with diarrhea at midnight and has been doing so hourly, pt feels like she hasn't been able to keep anything down. Denies being around anyone with similar symptoms, states that she took 2 covid tests that were negative

## 2021-06-28 NOTE — ED Provider Notes (Signed)
[facility] Provider Note    Event Date/Time   First MD Initiated Contact with Patient 06/28/21 1506     (approximate)   History   n/v/d   HPI  ALLIA HIEGEL is a 26 y.o. female with no active medical problems who presents with nausea and vomiting since midnight associated with crampy abdominal pain mainly in the upper abdomen and with diarrhea.  There is no blood in either the vomitus or the stool.  The patient denies any fever or chills.  She states that she had a hamburger last night that tasted slightly off, but denies any other recent unusual foods and has not traveled.  She has no sick contacts.  The patient states that sometimes her urine has pneumonia smell but she denies dysuria, frequency, or hematuria.    Physical Exam   Triage Vital Signs: ED Triage Vitals  Enc Vitals Group     BP 06/28/21 1137 115/76     Pulse Rate 06/28/21 1137 98     Resp 06/28/21 1137 16     Temp 06/28/21 1137 97.8 F (36.6 C)     Temp Source 06/28/21 1137 Oral     SpO2 06/28/21 1137 98 %     Weight 06/28/21 1138 250 lb (113.4 kg)     Height 06/28/21 1138 5\' 8"  (1.727 m)     Head Circumference --      Peak Flow --      Pain Score 06/28/21 1138 8     Pain Loc --      Pain Edu? --      Excl. in GC? --     Most recent vital signs: Vitals:   06/28/21 1444 06/28/21 1642  BP: 118/70   Pulse: 88   Resp: 16   Temp:  99.2 F (37.3 C)  SpO2: 98%     General: Awake, no distress.  CV:  Good peripheral perfusion.  Resp:  Normal effort.  Abd:  Soft and nontender.  No distention.  Other:  Slightly dry mucous membranes.   ED Results / Procedures / Treatments   Labs (all labs ordered are listed, but only abnormal results are displayed) Labs Reviewed  COMPREHENSIVE METABOLIC PANEL - Abnormal; Notable for the following components:      Result Value   Glucose, Bld 135 (*)    Total Protein 8.6 (*)    All other components within normal limits  CBC - Abnormal;  Notable for the following components:   WBC 13.6 (*)    All other components within normal limits  URINALYSIS, ROUTINE W REFLEX MICROSCOPIC - Abnormal; Notable for the following components:   Color, Urine AMBER (*)    APPearance CLOUDY (*)    pH 9.0 (*)    Protein, ur 100 (*)    Leukocytes,Ua LARGE (*)    Bacteria, UA MANY (*)    Squamous Epithelial / LPF >50 (*)    Non Squamous Epithelial PRESENT (*)    All other components within normal limits  PREGNANCY, URINE     EKG     RADIOLOGY    PROCEDURES:  Critical Care performed: No  Procedures   MEDICATIONS ORDERED IN ED: Medications  ondansetron (ZOFRAN-ODT) disintegrating tablet 4 mg (4 mg Oral Given 06/28/21 1148)  sodium chloride 0.9 % bolus 1,000 mL (0 mLs Intravenous Stopped 06/28/21 1656)  metoCLOPramide (REGLAN) injection 10 mg (10 mg Intravenous Given 06/28/21 1638)  famotidine (PEPCID) IVPB 20 mg premix (0 mg Intravenous Stopped  06/28/21 1656)  LORazepam (ATIVAN) injection 1 mg (1 mg Intramuscular Given 06/28/21 1703)  famotidine (PEPCID) tablet 20 mg (20 mg Oral Given 06/28/21 1702)     IMPRESSION / MDM / ASSESSMENT AND PLAN / ED COURSE  I reviewed the triage vital signs and the nursing notes.  26 year old female with no active medical problems presents with nausea and vomiting since around midnight associate with diarrhea and crampy upper abdominal pain.  On exam her vital signs are normal except for borderline elevated temperature, abdomen is soft and nontender.  Differential diagnosis includes, but is not limited to, viral gastroenteritis, other viral syndrome, foodborne illness, gastritis, gastroparesis.  I reviewed the lab work-up, which is significant for mild leukocytosis consistent with viral infection.  Urinalysis shows significant bacteria and WBCs concerning for UTI.  The patient has no dysuria but does endorse a pneumonialike odor to her urine recently which is consistent with possible UTI.  We will give  fluids, Reglan, Pepcid, and and reassess.  Anticipate discharge home when the patient is able to tolerate p.o.  ----------------------------------------- 5:47 PM on 06/28/2021 -----------------------------------------  The patient had an episode of acute anxiety after receiving the Reglan and asked for the IV to be removed, socially it got portion of the fluids.  This is consistent with akathisia.  The patient is not able to take Benadryl, so we gave a dose of IM Ativan as well as p.o. Pepcid.  Her symptoms have now resolved.  The patient's vomiting has also resolved.  She is tolerating p.o.  She feels well and would like to go home.  I counseled her on the results of the work-up and plan of care.  We will give an antibiotic for UTI.  Return precautions given, and she expresses understanding.      FINAL CLINICAL IMPRESSION(S) / ED DIAGNOSES   Final diagnoses:  Gastroenteritis  Urinary tract infection without hematuria, site unspecified     Rx / DC Orders   ED Discharge Orders          Ordered    ondansetron (ZOFRAN-ODT) 8 MG disintegrating tablet  Every 8 hours PRN        06/28/21 1745    famotidine (PEPCID) 20 MG tablet  2 times daily        06/28/21 1745    cephALEXin (KEFLEX) 500 MG capsule  2 times daily        06/28/21 1745             Note:  This document was prepared using Dragon voice recognition software and may include unintentional dictation errors.    Arta Silence, MD 06/28/21 1747

## 2023-04-12 LAB — PANORAMA PRENATAL TEST FULL PANEL:PANORAMA TEST PLUS 5 ADDITIONAL MICRODELETIONS: FETAL FRACTION: 6

## 2023-07-07 ENCOUNTER — Telehealth: Payer: Medicaid Other | Admitting: Nurse Practitioner
